# Patient Record
Sex: Female | Born: 1970 | Race: White | Hispanic: No | Marital: Single | Smoking: Current every day smoker
Health system: Southern US, Community
[De-identification: ages and names within clinical notes are randomized; demographics above are authoritative.]

## PROBLEM LIST (undated history)

## (undated) HISTORY — PX: TUBAL LIGATION: SHX77

## (undated) HISTORY — PX: FINGER SURGERY: SHX640

---

## 2018-04-03 ENCOUNTER — Other Ambulatory Visit: Payer: Self-pay

## 2018-04-03 ENCOUNTER — Emergency Department (HOSPITAL_COMMUNITY): Payer: Medicaid Other

## 2018-04-03 ENCOUNTER — Emergency Department (HOSPITAL_COMMUNITY)
Admission: EM | Admit: 2018-04-03 | Discharge: 2018-04-03 | Disposition: A | Discharge status: Home / Self Care | Payer: Medicaid Other | Attending: Emergency Medicine | Admitting: Emergency Medicine

## 2018-04-03 ENCOUNTER — Encounter (HOSPITAL_COMMUNITY): Payer: Self-pay | Admitting: *Deleted

## 2018-04-03 DIAGNOSIS — Z79899 Other long term (current) drug therapy: Secondary | ICD-10-CM | POA: Insufficient documentation

## 2018-04-03 DIAGNOSIS — R079 Chest pain, unspecified: Secondary | ICD-10-CM | POA: Insufficient documentation

## 2018-04-03 DIAGNOSIS — F17200 Nicotine dependence, unspecified, uncomplicated: Secondary | ICD-10-CM | POA: Diagnosis not present

## 2018-04-03 LAB — CBC
HEMATOCRIT: 43 % (ref 36.0–46.0)
HEMOGLOBIN: 14.3 g/dL (ref 12.0–15.0)
MCH: 28.9 pg (ref 26.0–34.0)
MCHC: 33.3 g/dL (ref 30.0–36.0)
MCV: 86.9 fL (ref 80.0–100.0)
Platelets: 258 10*3/uL (ref 150–400)
RBC: 4.95 MIL/uL (ref 3.87–5.11)
RDW: 12.1 % (ref 11.5–15.5)
WBC: 6 10*3/uL (ref 4.0–10.5)
nRBC: 0 % (ref 0.0–0.2)

## 2018-04-03 LAB — BASIC METABOLIC PANEL
Anion gap: 7 (ref 5–15)
BUN: 16 mg/dL (ref 6–20)
CO2: 25 mmol/L (ref 22–32)
Calcium: 9 mg/dL (ref 8.9–10.3)
Chloride: 106 mmol/L (ref 98–111)
Creatinine, Ser: 0.67 mg/dL (ref 0.44–1.00)
GFR calc Af Amer: >60 mL/min (ref >60)
GFR calc non Af Amer: >60 mL/min (ref >60)
Glucose, Bld: 95 mg/dL (ref 70–99)
Potassium: 3.6 mmol/L (ref 3.5–5.1)
Sodium: 138 mmol/L (ref 135–145)

## 2018-04-03 LAB — TROPONIN I: Troponin I: <0.03 ng/mL (ref <0.03)

## 2018-04-03 MED ORDER — SODIUM CHLORIDE 0.9 % IV BOLUS
500.0000 mL | Freq: Once | INTRAVENOUS | Status: AC
  Administered 2018-04-03: 500 mL via INTRAVENOUS

## 2018-04-03 MED ORDER — KETOROLAC TROMETHAMINE 30 MG/ML IJ SOLN
30.0000 mg | Freq: Once | INTRAMUSCULAR | Status: AC
  Administered 2018-04-03: 30 mg via INTRAVENOUS
  Filled 2018-04-03: qty 1

## 2018-04-03 NOTE — ED Provider Notes (Signed)
[facility] EMERGENCY DEPARTMENT Provider Note   CSN: [id number] Arrival date & time: 04/03/18  1626     History   Chief Complaint Chief Complaint  Patient presents with  . Chest Pain    HPI Hannah Forbes is a 48 y.o. female.  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  Questionable dyspnea.  No diaphoresis or nausea.  No previous history of cardiac disease.  No prolonged travel or immobilization.  She smokes 1/2 packs/day.  No history of hypertension or diabetes.  Family history negative for early MI.  Severity is mild to moderate.  Nothing makes symptoms better or worse.     History reviewed. No pertinent past medical history.  There are no active problems to display for this patient.   Past Surgical History:  Procedure Laterality Date  . FINGER SURGERY    . TUBAL LIGATION       OB History   No obstetric history on file.      Home Medications    Prior to Admission medications   Medication Sig Start Date End Date Taking? Authorizing Provider  famotidine (ACID REDUCER) 10 MG tablet Take 10 mg by mouth daily as needed for heartburn or indigestion.   Yes [provider]  Nutritional Supplements (MENOPAUSE FORMULA PO) Take 1 tablet by mouth daily.   Yes [provider]    Family History No family history on file.  Social History Social History   Tobacco Use  . Smoking status: Current Every Day Smoker    Packs/day: 0.50  . Smokeless tobacco: Never Used  Substance Use Topics  . Alcohol use: Never    Frequency: Never  . Drug use: Never     Allergies   Morphine and related and Lemon flavor   Review of Systems Review of Systems  All other systems reviewed and are negative.    Physical Exam Updated Vital Signs BP 118/72   Pulse 70   Temp 97.6 F (36.4 C) (Oral)   Resp 16   Ht 5\' 2"  (1.575 m)   Wt 78 kg   LMP  (LMP Unknown)   SpO2 100%   BMI 31.46 kg/m   Physical Exam Vitals signs and nursing note  reviewed.  Constitutional:      Appearance: She is well-developed.  HENT:     Head: Normocephalic and atraumatic.  Eyes:     Conjunctiva/sclera: Conjunctivae normal.  Neck:     Musculoskeletal: Neck supple.  Cardiovascular:     Rate and Rhythm: Normal rate and regular rhythm.  Pulmonary:     Effort: Pulmonary effort is normal.     Breath sounds: Normal breath sounds.     Comments: Chest wall tender to palpation. Abdominal:     General: Bowel sounds are normal.     Palpations: Abdomen is soft.  Musculoskeletal: Normal range of motion.  Skin:    General: Skin is warm and dry.  Neurological:     Mental Status: She is alert and oriented to person, place, and time.  Psychiatric:        Behavior: Behavior normal.      ED Treatments / Results  Labs (all labs ordered are listed, but only abnormal results are displayed) Labs Reviewed  BASIC METABOLIC PANEL  CBC  TROPONIN I    EKG EKG Interpretation  Date/Time:  Friday April 03 2018 16:36:31 EST Ventricular Rate:  72 PR Interval:    QRS Duration: 73 QT Interval:  380 QTC Calculation:  416 R Axis:   53 Text Interpretation:  Sinus rhythm Abnormal R-wave progression, early transition Confirmed by Donnetta Hutching ([id number]) on 04/03/2018 5:42:02 PM   Radiology Dg Chest 2 View  Result Date: 04/03/2018 CLINICAL DATA:  Chest pain EXAM: CHEST - 2 VIEW COMPARISON:  None. FINDINGS: Cardiac shadows within normal limits. The lungs are well aerated bilaterally. Mild bronchitic changes are seen without focal confluent infiltrate. No effusion is seen. No bony abnormality is noted. IMPRESSION: Mild bronchitic changes without focal infiltrate. Electronically Signed   By: Alcide Clever M.D.   On: 04/03/2018 17:25    Procedures Procedures (including critical care time)  Medications Ordered in ED Medications  sodium chloride 0.9 % bolus 500 mL (0 mLs Intravenous Stopped 04/03/18 1934)  ketorolac (TORADOL) 30 MG/ML injection 30 mg (30 mg  Intravenous Given 04/03/18 1759)     Initial Impression / Assessment and Plan / ED Course  I have reviewed the triage vital signs and the nursing notes.  Pertinent labs & imaging results that were available during my care of the patient were reviewed by me and considered in my medical decision making (see chart for details).    Patient presents with chest pain. She is a cigarette smoker but otherwise has a low risk profile for ACS or PE.  Screening tests including EKG, chest x-ray, troponin all negative.  Suspect chest wall pain.  Final Clinical Impressions(s) / ED Diagnoses   Final diagnoses:  Chest pain, unspecified type    ED Discharge Orders    None       Donnetta Hutching, MD 04/03/18 2204

## 2018-04-03 NOTE — Discharge Instructions (Signed)
Test showed no evidence of a heart attack.  Tylenol or ibuprofen for pain.  Follow-up with your primary care doctor. Stop smoking.

## 2018-04-03 NOTE — ED Triage Notes (Signed)
Pt c/o mid chest pain that started this afternoon and has been constant. Pt describes the pain as pressure. Pt reports the pain takes her breath away. Denies nausea, dizziness, lightheadedness.

## 2018-04-03 NOTE — ED Notes (Signed)
ED Provider at bedside. 

## 2020-04-18 IMAGING — DX DG CHEST 2V
2 series · 2 of 2 positions shown · non-contrast
Comparison: None.

CLINICAL DATA: Chest pain

EXAM:
CHEST - 2 VIEW

[chest pa]
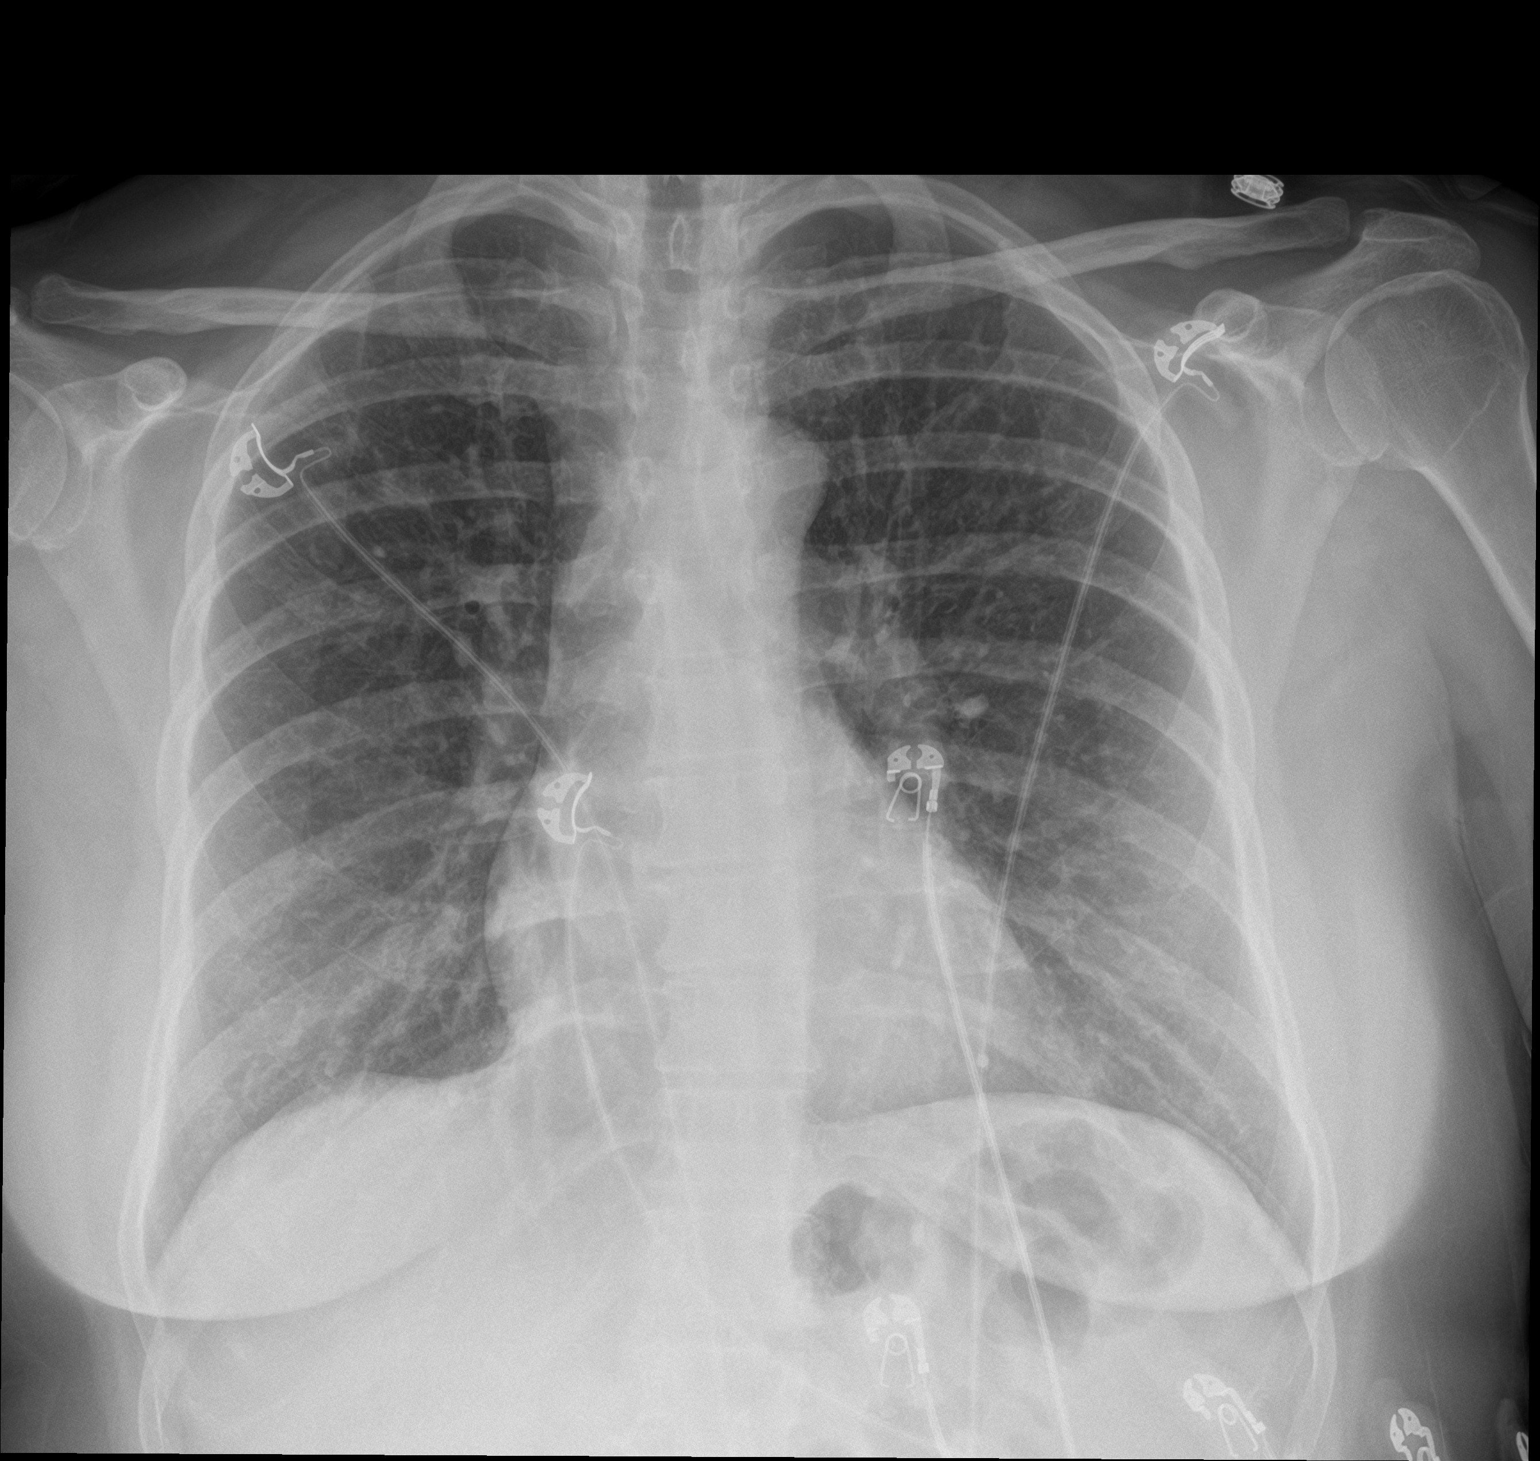

[chest lat]
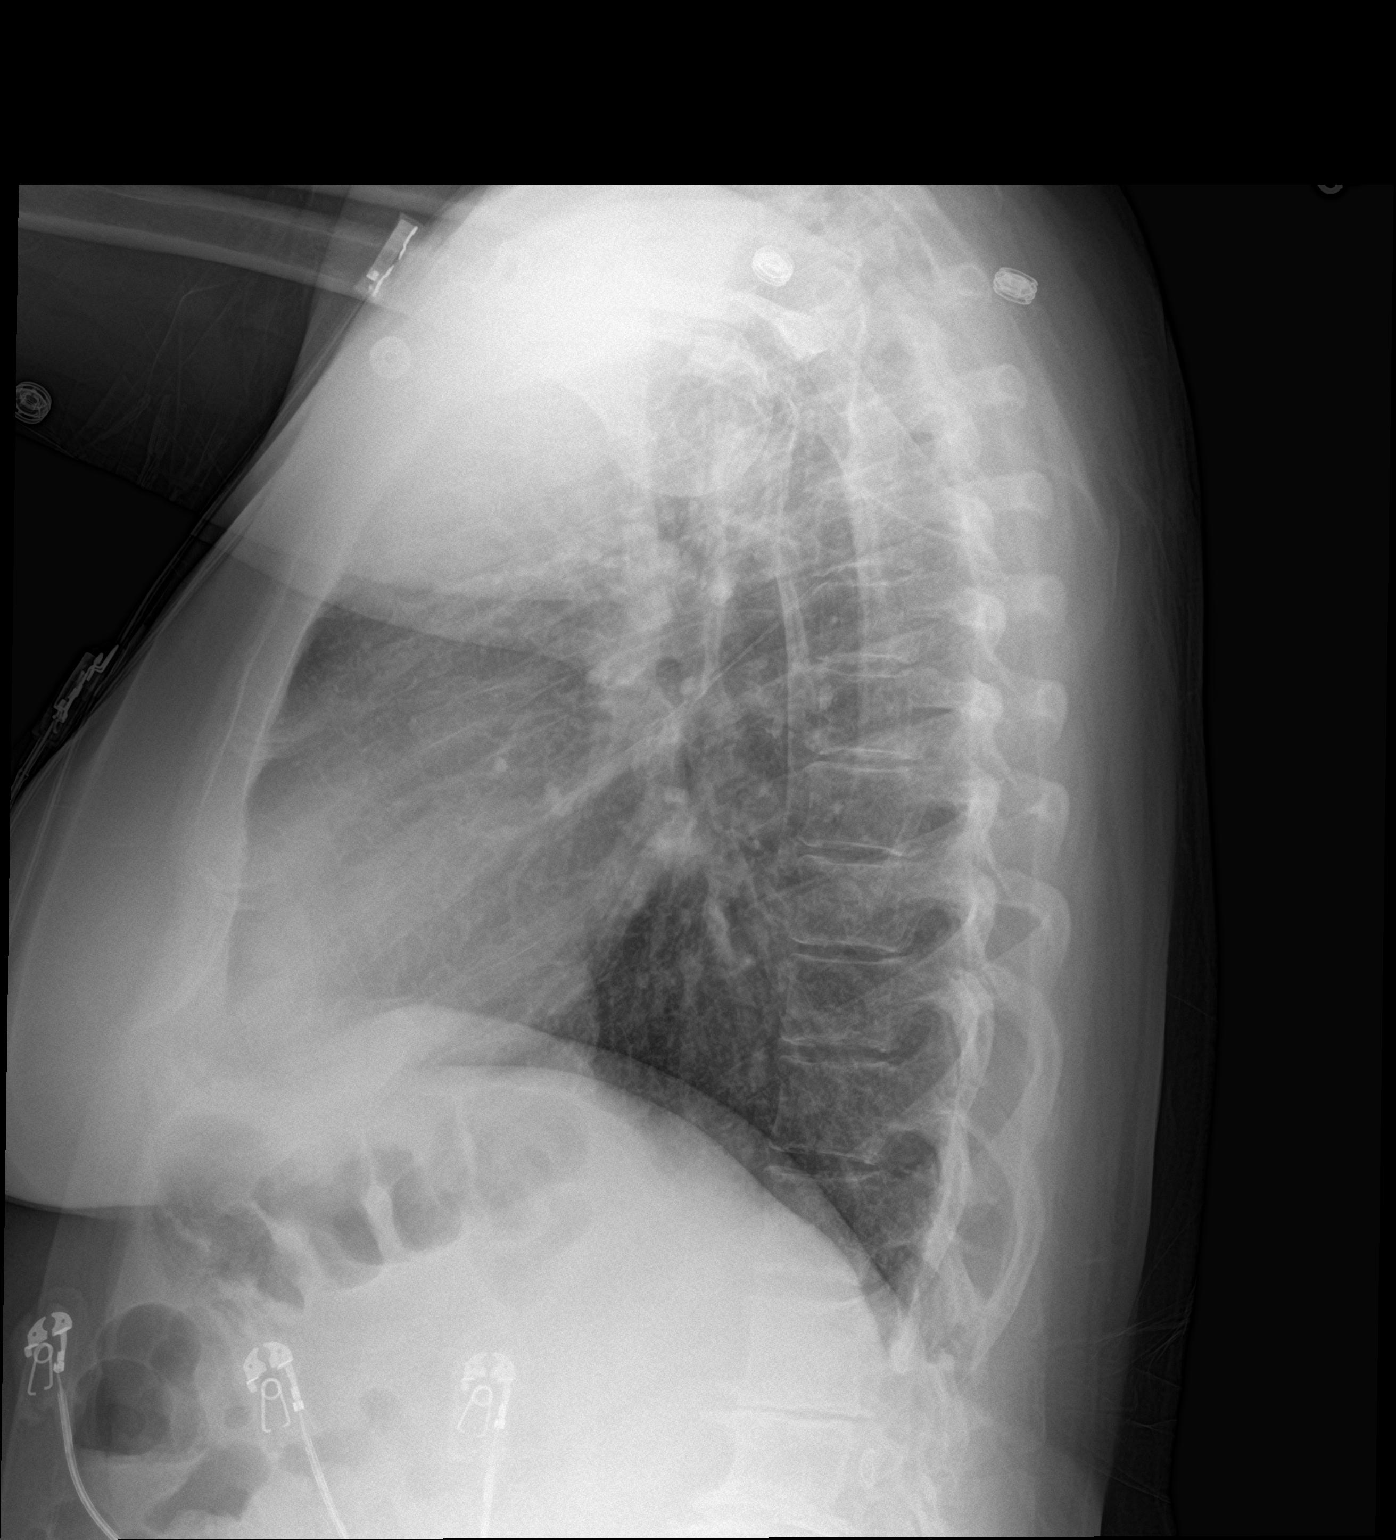

[2 of 2 positions shown; findings below may reference images not displayed]

FINDINGS: Cardiac shadows within normal limits. The lungs are well aerated
bilaterally. Mild bronchitic changes are seen without focal
confluent infiltrate. No effusion is seen. No bony abnormality is
noted.
IMPRESSION: Mild bronchitic changes without focal infiltrate.

## 2022-09-23 ENCOUNTER — Other Ambulatory Visit (HOSPITAL_COMMUNITY): Payer: Self-pay | Admitting: General Practice

## 2022-09-23 ENCOUNTER — Ambulatory Visit (HOSPITAL_COMMUNITY)
Admission: RE | Admit: 2022-09-23 | Discharge: 2022-09-23 | Disposition: A | Discharge status: Home / Self Care | Payer: Medicaid Other | Source: Ambulatory Visit | Attending: General Practice | Admitting: General Practice

## 2022-09-23 DIAGNOSIS — M79661 Pain in right lower leg: Secondary | ICD-10-CM | POA: Insufficient documentation

## 2022-09-23 DIAGNOSIS — R600 Localized edema: Secondary | ICD-10-CM | POA: Insufficient documentation

## 2023-09-17 ENCOUNTER — Encounter (INDEPENDENT_AMBULATORY_CARE_PROVIDER_SITE_OTHER): Payer: Self-pay | Admitting: *Deleted

## 2023-09-24 ENCOUNTER — Ambulatory Visit (INDEPENDENT_AMBULATORY_CARE_PROVIDER_SITE_OTHER): Admitting: Gastroenterology

## 2023-09-24 ENCOUNTER — Encounter (INDEPENDENT_AMBULATORY_CARE_PROVIDER_SITE_OTHER): Payer: Self-pay

## 2023-10-06 ENCOUNTER — Ambulatory Visit (INDEPENDENT_AMBULATORY_CARE_PROVIDER_SITE_OTHER): Admitting: Gastroenterology

## 2023-10-07 ENCOUNTER — Encounter (INDEPENDENT_AMBULATORY_CARE_PROVIDER_SITE_OTHER): Payer: Self-pay | Admitting: *Deleted
